# Patient Record
Sex: Female | Born: 1980 | Race: White | Hispanic: No | Marital: Married | Smoking: Current every day smoker
Health system: Southern US, Community
[De-identification: ages and names within clinical notes are randomized; demographics above are authoritative.]

## PROBLEM LIST (undated history)

## (undated) DIAGNOSIS — B009 Herpesviral infection, unspecified: Secondary | ICD-10-CM

## (undated) HISTORY — DX: Herpesviral infection, unspecified: B00.9

---

## 2004-05-15 ENCOUNTER — Emergency Department (HOSPITAL_COMMUNITY): Admission: EM | Admit: 2004-05-15 | Discharge: 2004-05-15 | Payer: Self-pay | Admitting: Emergency Medicine

## 2005-11-01 ENCOUNTER — Emergency Department (HOSPITAL_COMMUNITY): Admission: EM | Admit: 2005-11-01 | Discharge: 2005-11-02 | Payer: Self-pay | Admitting: Emergency Medicine

## 2006-12-28 ENCOUNTER — Inpatient Hospital Stay (HOSPITAL_COMMUNITY): Admission: AD | Admit: 2006-12-28 | Discharge: 2006-12-28 | Payer: Self-pay | Admitting: Obstetrics and Gynecology

## 2007-03-14 ENCOUNTER — Inpatient Hospital Stay (HOSPITAL_COMMUNITY): Admission: RE | Admit: 2007-03-14 | Discharge: 2007-03-15 | Payer: Self-pay | Admitting: Obstetrics and Gynecology

## 2009-09-15 ENCOUNTER — Inpatient Hospital Stay (HOSPITAL_COMMUNITY): Admission: RE | Admit: 2009-09-15 | Discharge: 2009-09-16 | Payer: Self-pay | Admitting: Obstetrics and Gynecology

## 2010-10-21 LAB — CBC
HCT: 38.9 % (ref 36.0–46.0)
Hemoglobin: 11.2 g/dL — ABNORMAL LOW (ref 12.0–15.0)
MCHC: 33.4 g/dL (ref 30.0–36.0)
MCV: 92.6 fL (ref 78.0–100.0)
Platelets: 189 10*3/uL (ref 150–400)
RDW: 14.6 % (ref 11.5–15.5)
RDW: 15.4 % (ref 11.5–15.5)
WBC: 21.6 10*3/uL — ABNORMAL HIGH (ref 4.0–10.5)

## 2010-12-18 NOTE — Discharge Summary (Signed)
Teresa Duran, Teresa Duran                 ACCOUNT NO.:  [id number]   MEDICAL RECORD NO.:  [id number]          PATIENT TYPE:  INP   LOCATION:  9144                          FACILITY:  [facility]   PHYSICIAN:  Huel Cote, M.D. DATE OF BIRTH:  06-01-1981   DATE OF ADMISSION:  03/14/2007  DATE OF DISCHARGE:  03/15/2007                               DISCHARGE SUMMARY   DISCHARGE DIAGNOSES:  1. Term pregnancy at 40 weeks delivered.  2. Status post normal spontaneous vaginal delivery.   DISCHARGE MEDICATIONS:  1. Motrin 600 mg p.o. every 6 hours.  2. Percocet one to two tablets p.o. every 4 hours p.r.n.   DISCHARGE FOLLOWUP:  The patient is to follow up in 6 weeks for her full  postpartum exam.   HOSPITAL COURSE:  The patient is 30 year old G1, P0 who was admitted at  74 and one-seventh weeks gestation for induction of labor given past due  date and a favorable cervix.  Prenatal care had been uncomplicated with  positive history of herpes but on Valtrex suppression.  She was also a  smoker.  Prenatal labs are as follows:  A positive, antibody negative,  RPR nonreactive, rubella immune, hepatitis B surface antigen negative,  HIV negative, GC negative, chlamydia negative, 1-hour Glucola 117, group  B strep negative.  Past OB history:  None.  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  Past surgical history:  Wisdom teeth.  Past medical history:  None.  Allergies:  None.  Home medications  included Valtrex and prenatal vitamins.   On admission she was afebrile with stable vital signs.  Fetal heart rate  was reactive.  Cervix was 80, 2+, and -2 station.  She had rupture of  membranes performed and was placed on Pitocin.  She reached complete  dilation and pushed well with normal spontaneous vaginal delivery of a  vigorous female infant.  Apgars were 9 and 9, weight was 6 pounds 12  ounces.  Placenta delivered spontaneously.  There is a right  periurethral laceration which was repaired  with 3-0 Vicryl.  Cervix and  rectum were intact.  By postpartum day #1 the patient was doing quite  well and requested early discharge home.  She was given Motrin and  Percocet prescriptions and instructed on pelvic rest and followup in 6  weeks.      Huel Cote, M.D.  Electronically Signed     KR/MEDQ  D:  04/07/2007  T:  04/07/2007  Job:  [id number]

## 2011-03-24 ENCOUNTER — Ambulatory Visit: Payer: Self-pay | Admitting: Gynecologic Oncology

## 2011-05-17 LAB — CBC
HCT: 35.3 — ABNORMAL LOW
MCHC: 33.9
Platelets: 142 — ABNORMAL LOW
RBC: 4.16
WBC: 14.3 — ABNORMAL HIGH

## 2011-05-17 LAB — RPR: RPR Ser Ql: NONREACTIVE

## 2012-11-30 ENCOUNTER — Emergency Department (HOSPITAL_BASED_OUTPATIENT_CLINIC_OR_DEPARTMENT_OTHER)
Admission: EM | Admit: 2012-11-30 | Discharge: 2012-11-30 | Disposition: A | Discharge status: Home / Self Care | Payer: 59 | Attending: Emergency Medicine | Admitting: Emergency Medicine

## 2012-11-30 ENCOUNTER — Encounter (HOSPITAL_BASED_OUTPATIENT_CLINIC_OR_DEPARTMENT_OTHER): Payer: Self-pay | Admitting: Emergency Medicine

## 2012-11-30 DIAGNOSIS — Z3202 Encounter for pregnancy test, result negative: Secondary | ICD-10-CM | POA: Insufficient documentation

## 2012-11-30 DIAGNOSIS — R031 Nonspecific low blood-pressure reading: Secondary | ICD-10-CM | POA: Insufficient documentation

## 2012-11-30 DIAGNOSIS — R112 Nausea with vomiting, unspecified: Secondary | ICD-10-CM | POA: Insufficient documentation

## 2012-11-30 DIAGNOSIS — N390 Urinary tract infection, site not specified: Secondary | ICD-10-CM | POA: Insufficient documentation

## 2012-11-30 DIAGNOSIS — F17200 Nicotine dependence, unspecified, uncomplicated: Secondary | ICD-10-CM | POA: Insufficient documentation

## 2012-11-30 DIAGNOSIS — R42 Dizziness and giddiness: Secondary | ICD-10-CM | POA: Insufficient documentation

## 2012-11-30 DIAGNOSIS — R55 Syncope and collapse: Secondary | ICD-10-CM | POA: Insufficient documentation

## 2012-11-30 LAB — URINALYSIS, ROUTINE W REFLEX MICROSCOPIC
Bilirubin Urine: NEGATIVE
Nitrite: NEGATIVE
Specific Gravity, Urine: 1.02 (ref 1.005–1.030)
pH: 6.5 (ref 5.0–8.0)

## 2012-11-30 LAB — PREGNANCY, URINE: Preg Test, Ur: NEGATIVE

## 2012-11-30 LAB — URINE MICROSCOPIC-ADD ON

## 2012-11-30 MED ORDER — NITROFURANTOIN MONOHYD MACRO 100 MG PO CAPS
100.0000 mg | ORAL_CAPSULE | Freq: Once | ORAL | Status: AC
  Administered 2012-11-30: 100 mg via ORAL
  Filled 2012-11-30: qty 1

## 2012-11-30 MED ORDER — NITROFURANTOIN MONOHYD MACRO 100 MG PO CAPS: 100.0000 mg | ORAL_CAPSULE | Freq: Two times a day (BID) | ORAL | Status: DC

## 2012-11-30 MED ORDER — ONDANSETRON HCL 4 MG/2ML IJ SOLN
4.0000 mg | Freq: Once | INTRAMUSCULAR | Status: DC
  Filled 2012-11-30: qty 2

## 2012-11-30 MED ORDER — SODIUM CHLORIDE 0.9 % IV BOLUS (SEPSIS)
1000.0000 mL | Freq: Once | INTRAVENOUS | Status: AC
  Administered 2012-11-30: 1000 mL via INTRAVENOUS

## 2012-11-30 NOTE — ED Provider Notes (Addendum)
History     CSN: [id number]  Arrival date & time 11/30/12  0059   First MD Initiated Contact with Patient 11/30/12 0106      Chief Complaint  Patient presents with  . Syncope     (Consider location/radiation/quality/duration/timing/severity/associated sxs/prior treatment) HPI This is a 32 year old female who just returned from a trip to a water park yesterday. The water park was very hot and stuffy. The patient states she had not been eating or drinking as well as she should. She was preparing to walk up the stairs at home just prior to arrival when she felt lightheaded. About 2 minutes later she had a syncopal episode. She was incontinent of urine but there was no tonic-clonic activity noted. When she regained consciousness she became nauseated and vomited. She was given IV fluids by EMS prior to arrival with improvement in symptoms. She was not given an antiemetic to arrival because her blood pressure was low. She is only mildly nauseated at this time. She had no chest pain or shortness of breath.  History reviewed. No pertinent past medical history.  History reviewed. No pertinent past surgical history.  No family history on file.  History  Substance Use Topics  . Smoking status: Current Every Day Smoker  . Smokeless tobacco: Not on file  . Alcohol Use: Yes    OB History   Grav Para Term Preterm Abortions TAB SAB Ect Mult Living                  Review of Systems  All other systems reviewed and are negative.    Allergies  Review of patient's allergies indicates no known allergies.  Home Medications  No current outpatient prescriptions on file.  BP 99/66  Pulse 62  Temp(Src) 98.2 F (36.8 C) (Oral)  Resp 18  Ht 5' 3.75" (1.619 m)  Wt 113 lb (51.256 kg)  BMI 19.55 kg/m2  SpO2 99%  Physical Exam General: Well-developed, well-nourished female in no acute distress; appearance consistent with age of record HENT: normocephalic, atraumatic; no bite marks on  tongue Eyes: pupils equal round and reactive to light; extraocular muscles intact Neck: supple Heart: regular rate and rhythm; no ectopy Lungs: clear to auscultation bilaterally Abdomen: soft; nondistended; nontender; no masses or hepatosplenomegaly; bowel sounds present Extremities: No deformity; full range of motion; pulses normal; no edema Neurologic: Awake, alert and oriented; motor function intact in all extremities and symmetric; no facial droop Skin: Warm and dry Psychiatric: Normal mood and affect    ED Course  Procedures (including critical care time)     MDM   Nursing notes and vitals signs, including pulse oximetry, reviewed.  Summary of this visit's results, reviewed by myself:  Labs:  Results for orders placed during the hospital encounter of 11/30/12 (from the past 24 hour(s))  URINALYSIS, ROUTINE W REFLEX MICROSCOPIC     Status: Abnormal   Collection Time    11/30/12  1:26 AM      Result Value Range   Color, Urine YELLOW  YELLOW   APPearance CLEAR  CLEAR   Specific Gravity, Urine 1.020  1.005 - 1.030   pH 6.5  5.0 - 8.0   Glucose, UA NEGATIVE  NEGATIVE mg/dL   Hgb urine dipstick NEGATIVE  NEGATIVE   Bilirubin Urine NEGATIVE  NEGATIVE   Ketones, ur NEGATIVE  NEGATIVE mg/dL   Protein, ur NEGATIVE  NEGATIVE mg/dL   Urobilinogen, UA 0.2  0.0 - 1.0 mg/dL   Nitrite NEGATIVE  NEGATIVE  Leukocytes, UA MODERATE (*) NEGATIVE  PREGNANCY, URINE     Status: None   Collection Time    11/30/12  1:26 AM      Result Value Range   Preg Test, Ur NEGATIVE  NEGATIVE  URINE MICROSCOPIC-ADD ON     Status: Abnormal   Collection Time    11/30/12  1:26 AM      Result Value Range   Squamous Epithelial / LPF MANY (*) RARE   WBC, UA 7-10  <3 WBC/hpf   RBC / HPF 0-2  <3 RBC/hpf   Bacteria, UA MANY (*) RARE   Urine-Other MUCOUS PRESENT      Date: 11/30/2012 1:15 AM  Rate: 65  Rhythm: normal sinus rhythm  QRS Axis: normal  Intervals: normal  ST/T Wave abnormalities:  normal  Conduction Disutrbances: none  Narrative Interpretation: unremarkable  Comparison with previous EKG: none available  2:11 AM The patient feels better after IV fluid bolus. She states she has not had a period in over a year due to an IUD.  Her husband, who witnessed her syncope, denies any seizure-like activity. He states she looked very pale before she came to.          Hanley Seamen, MD 11/30/12 1610  Hanley Seamen, MD 11/30/12 9604

## 2012-11-30 NOTE — ED Notes (Signed)
Pt had syncopal episode tonight. Pt vomited after. Pt was hypotensive with EMS. Pt states she didn't eat dinner tonight.

## 2012-11-30 NOTE — ED Notes (Signed)
Pt received a NS bolus by EMS

## 2012-11-30 NOTE — ED Notes (Signed)
Zofran was ordered by MD. Pt states she is no longer nauseated and declines zofran. MD made aware.

## 2012-12-01 LAB — URINE CULTURE: Colony Count: 5000

## 2013-01-01 ENCOUNTER — Encounter: Payer: Self-pay | Admitting: Diagnostic Neuroimaging

## 2013-01-01 ENCOUNTER — Ambulatory Visit (INDEPENDENT_AMBULATORY_CARE_PROVIDER_SITE_OTHER): Payer: 59 | Admitting: Diagnostic Neuroimaging

## 2013-01-01 VITALS — BP 97/71 | HR 94 | Temp 98.7°F | Ht 63.75 in | Wt 112.5 lb

## 2013-01-01 DIAGNOSIS — R55 Syncope and collapse: Secondary | ICD-10-CM

## 2013-01-01 NOTE — Progress Notes (Signed)
[facility]  PATIENT: Teresa Duran DOB: 1980-08-15  REFERRING CLINICIAN:  Marinda Elk, MD HISTORY FROM: patient, ER notes, husband notes REASON FOR VISIT: consult   HISTORICAL  CHIEF COMPLAINT:  Chief Complaint  Patient presents with  . Neurologic Problem    passed out for about 45 mins    HISTORY OF PRESENT ILLNESS:  This is a 32 year old female who had a syncopal episode just before midnight on 11/29/12.    She had just returned from a trip to a water park the day before. The water park was very hot and stuffy. The patient states she had not been eating or drinking as well as she should. She was preparing to walk up the stairs at home when she felt lightheaded. About 2 minutes later she had a syncopal episode. Her husband reports that she collapsed and became unresponsive. Her eyes were open but "the lights weren't on."  She stated her husband thought she wasn't breathing and started CPR (chest thump, no rescue breathing) and quickly called EMS which arrived in 5 minutes. He did not check for pulse. She was incontinent of urine but there was no tonic-clonic activity noted by her husband. No tongue biting. When she regained consciousness she became nauseated and vomited. She regained consciousness before EMS arrived. She was not given an antiemetic to arrival because her blood pressure was low. She had no chest pain or shortness of breath. Patient reports extreme physical/mental disorientation for about 45 minutes after the episode. She was given IV fluids by EMS prior to arrival to ER with improvement in symptoms.   Patient reports that she has daily episodes of feeling lightheaded but does not pass out. She states that she probably doesn't drink as much water as she should.  She has complaints of frequent headaches, mostly late in the day with sensitivity to light and sound.  She states that she doesn't eat much during the day. She has never had an episode like this  before. She knows of no family history of seizure, cardiac issues such as sudden cardiac death.   REVIEW OF SYSTEMS: Full 14 system review of systems performed and notable only for constitutional: Fatigue, eyes: Eye pain, respiratory: Cough, ears/nose/throat: Hearing in the ears, spinning sensation, musculoskeletal: Joint pain, allergy: Allergies, runny nose, skin: Rash, neurological: Confusion, headache, numbness, dizziness, seizure? Passing out? Sleep: Sleepiness, psychiatric: Not met sleep, decreased energy.  ALLERGIES: Allergies  Allergen Reactions  . Other     cats    HOME MEDICATIONS: Outpatient Prescriptions Prior to Visit  Medication Sig Dispense Refill  . nitrofurantoin, macrocrystal-monohydrate, (MACROBID) 100 MG capsule Take 1 capsule (100 mg total) by mouth 2 (two) times daily. X 7 days  14 capsule  0   No facility-administered medications prior to visit.    PAST MEDICAL HISTORY: Past Medical History  Diagnosis Date  . Herpes     PAST SURGICAL HISTORY: History reviewed. No pertinent past surgical history.  FAMILY HISTORY: Family History  Problem Relation Age of Onset  . Prostate cancer Paternal Uncle   . Diabetes Maternal Grandmother     SOCIAL HISTORY: History   Social History  . Marital Status: Married    Spouse Name: Riki Rusk    Number of Children: 2  . Years of Education: BAx2   Occupational History  . [employer]    Social History Main Topics  . Smoking status: Current Every Day Smoker -- 1.00 packs/day for 16 years    Types: Cigarettes  .  Smokeless tobacco: Never Used  . Alcohol Use: Yes     Comment: socially  . Drug Use: Yes    Special: Marijuana     Comment: occasional  . Sexually Active: Not on file   Other Topics Concern  . Not on file   Social History Narrative   Pt live at home with spouse.   Caffeine Use: 1-2 cups daily     PHYSICAL EXAM  Filed Vitals:   01/01/13 0932 01/01/13 0950  BP: 90/63 97/71  Pulse: 81 94  Temp:  98.7 F (37.1 C)   TempSrc: Oral   Height: 5' 3.75" (1.619 m)   Weight: 112 lb 8 oz (51.03 kg)    Body mass index is 19.47 kg/(m^2).  GENERAL EXAM: Patient is in no distress, Thin female, pleasant  CARDIOVASCULAR: Regular rate and rhythm, no murmurs, no carotid bruits  NEUROLOGIC: MENTAL STATUS: awake, alert, language fluent, comprehension intact, naming intact CRANIAL NERVE: no papilledema on fundoscopic exam, pupils equal and reactive to light, visual fields full to confrontation, extraocular muscles intact, no nystagmus, facial sensation and strength symmetric, uvula midline, shoulder shrug symmetric, tongue midline. MOTOR: normal bulk and tone, full strength in the BUE, BLE SENSORY: normal and symmetric to light touch, pinprick, temperature, vibration  COORDINATION: finger-nose-finger, fine finger movements normal REFLEXES: deep tendon reflexes present and symmetric GAIT/STATION: narrow based gait; able to walk on toes, heels and tandem; romberg is negative   DIAGNOSTIC DATA (LABS, IMAGING, TESTING) - I reviewed patient records, labs, notes, testing and imaging myself where available.  Urinalysis:Routine with reflex micro 11/30/12 Moderate Leukocytes, Many Bacteria  EKG, 11/30/12: NSR, Rate 65.   ASSESSMENT AND PLAN  32 y.o. year old female  here with episode of loss of consciousness which occurred in November 29, 2012.  Most likely this was syncope triggered by dehydration and UTI (prodromal lightheadedness, low BP, pale appearance, nausea).  Seizure is also a possibility (prolonged post-ictal, eyes open, confusion). We will check a MRI of the brain and EEG to evaluate for seizure.  We recommend patient does not drive if she feels lightheaded.  Ddx: provoked syncope (dehydration, UTI, cardiogenic) vs. seizure  PLAN:  1. Check MRI brain and EEG 2. Follow up with Primary care to evaluate for cardiac etiologies (consider cardiology consult) 3. Okay to continue driving at this  time 4. Followup in office in 6-8 weeks   Suanne Marker, MD (with LYNN LAM NP-C 01/01/2013, 9:55 AM) Certified in Neurology, Neurophysiology and Neuroimaging  [facility] [address] [place], [place] [address] [phone]

## 2013-01-01 NOTE — Patient Instructions (Addendum)
We will order MRI of the brain and EEG to evaluate for seizures.  Follow up in 6-8 weeks.

## 2013-01-10 ENCOUNTER — Ambulatory Visit (INDEPENDENT_AMBULATORY_CARE_PROVIDER_SITE_OTHER): Payer: 59

## 2013-01-10 DIAGNOSIS — R55 Syncope and collapse: Secondary | ICD-10-CM

## 2013-01-16 ENCOUNTER — Telehealth: Payer: Self-pay | Admitting: Diagnostic Neuroimaging

## 2013-01-25 ENCOUNTER — Telehealth: Payer: Self-pay | Admitting: Diagnostic Neuroimaging

## 2013-01-25 NOTE — Telephone Encounter (Signed)
Called patient with normal MRI results still waiting on EEG results told patient i will call her once i have received those.

## 2013-01-26 NOTE — Procedures (Signed)
   [facility]  EEG (ELECTROENCEPHALOGRAM) REPORT   STUDY DATE: 01/10/13 PATIENT NAME: Teresa Duran DOB: [birth year] MRN: [id number]  ORDERING CLINICIAN: Joycelyn Schmid, MD   TECHNOLOGIST: Gearldine Shown TECHNIQUE: Electroencephalogram was recorded utilizing standard 10-20 system of lead placement and reformatted into average and bipolar montages.  RECORDING TIME: 29 minutes ACTIVATION: hyperventilation and photic stimulation  CLINICAL INFORMATION: 32 year old female with syncope.  FINDINGS: Background rhythms of 9-10 hertz and 70-80 microvolts. No focal, lateralizing, epileptiform activity or seizures are seen. Patient recorded in the awake state.   IMPRESSION:  Normal EEG in the awake state.   INTERPRETING PHYSICIAN:  Suanne Marker, MD Certified in Neurology, Neurophysiology and Neuroimaging  [facility] [address], [address] [place], [place] [address] [phone]

## 2013-03-20 ENCOUNTER — Ambulatory Visit: Payer: 59 | Admitting: Diagnostic Neuroimaging

## 2013-10-10 NOTE — Telephone Encounter (Signed)
Pt did not r/s visit closing encounter

## 2018-11-01 ENCOUNTER — Other Ambulatory Visit: Payer: Self-pay | Admitting: Obstetrics and Gynecology

## 2018-11-01 DIAGNOSIS — N6310 Unspecified lump in the right breast, unspecified quadrant: Secondary | ICD-10-CM

## 2018-11-06 ENCOUNTER — Ambulatory Visit
Admission: RE | Admit: 2018-11-06 | Discharge: 2018-11-06 | Disposition: A | Discharge status: Home / Self Care | Payer: Managed Care, Other (non HMO) | Source: Ambulatory Visit | Attending: Obstetrics and Gynecology | Admitting: Obstetrics and Gynecology

## 2018-11-06 ENCOUNTER — Other Ambulatory Visit: Payer: Self-pay

## 2018-11-06 ENCOUNTER — Other Ambulatory Visit: Payer: Self-pay | Admitting: Obstetrics and Gynecology

## 2018-11-06 DIAGNOSIS — N6310 Unspecified lump in the right breast, unspecified quadrant: Secondary | ICD-10-CM

## 2019-05-08 ENCOUNTER — Ambulatory Visit
Admission: RE | Admit: 2019-05-08 | Discharge: 2019-05-08 | Disposition: A | Discharge status: Home / Self Care | Payer: Managed Care, Other (non HMO) | Source: Ambulatory Visit | Attending: Obstetrics and Gynecology | Admitting: Obstetrics and Gynecology

## 2019-05-08 ENCOUNTER — Other Ambulatory Visit: Payer: Self-pay | Admitting: Obstetrics and Gynecology

## 2019-05-08 ENCOUNTER — Other Ambulatory Visit: Payer: Self-pay

## 2019-05-08 DIAGNOSIS — N6310 Unspecified lump in the right breast, unspecified quadrant: Secondary | ICD-10-CM

## 2019-05-11 ENCOUNTER — Other Ambulatory Visit: Payer: Managed Care, Other (non HMO)

## 2019-11-06 ENCOUNTER — Other Ambulatory Visit: Payer: Managed Care, Other (non HMO)

## 2020-01-23 ENCOUNTER — Ambulatory Visit
Admission: RE | Admit: 2020-01-23 | Discharge: 2020-01-23 | Disposition: A | Discharge status: Home / Self Care | Payer: Managed Care, Other (non HMO) | Source: Ambulatory Visit | Attending: Obstetrics and Gynecology | Admitting: Obstetrics and Gynecology

## 2020-01-23 ENCOUNTER — Other Ambulatory Visit: Payer: Self-pay

## 2020-01-23 DIAGNOSIS — N6310 Unspecified lump in the right breast, unspecified quadrant: Secondary | ICD-10-CM

## 2020-05-14 IMAGING — MG DIGITAL DIAGNOSTIC BILATERAL MAMMOGRAM WITH TOMO AND CAD
6 of 10 series · 6 of 30 positions shown · non-contrast
Comparison: None.

CLINICAL DATA: Patient describes a palpable lump within the RIGHT
breast. This is patient's baseline mammogram.

EXAM:
DIGITAL DIAGNOSTIC BILATERAL MAMMOGRAM WITH CAD AND TOMO
ULTRASOUND RIGHT BREAST

[L MLO synth-2D]
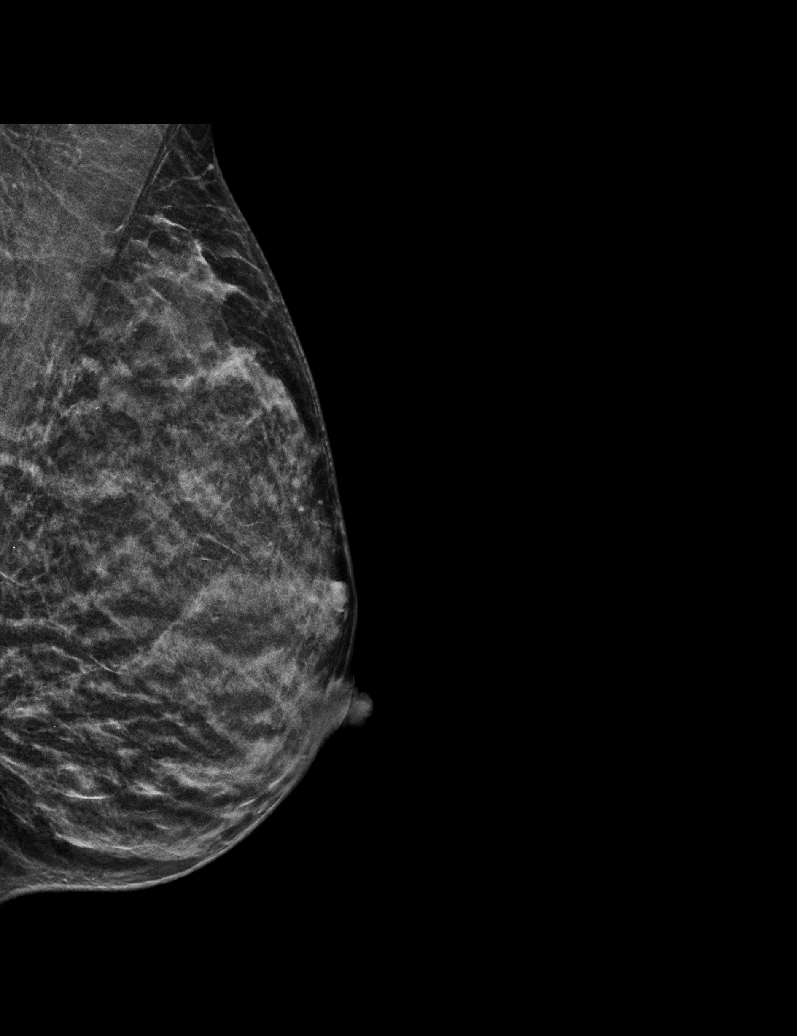

[R TAN synth-2D]
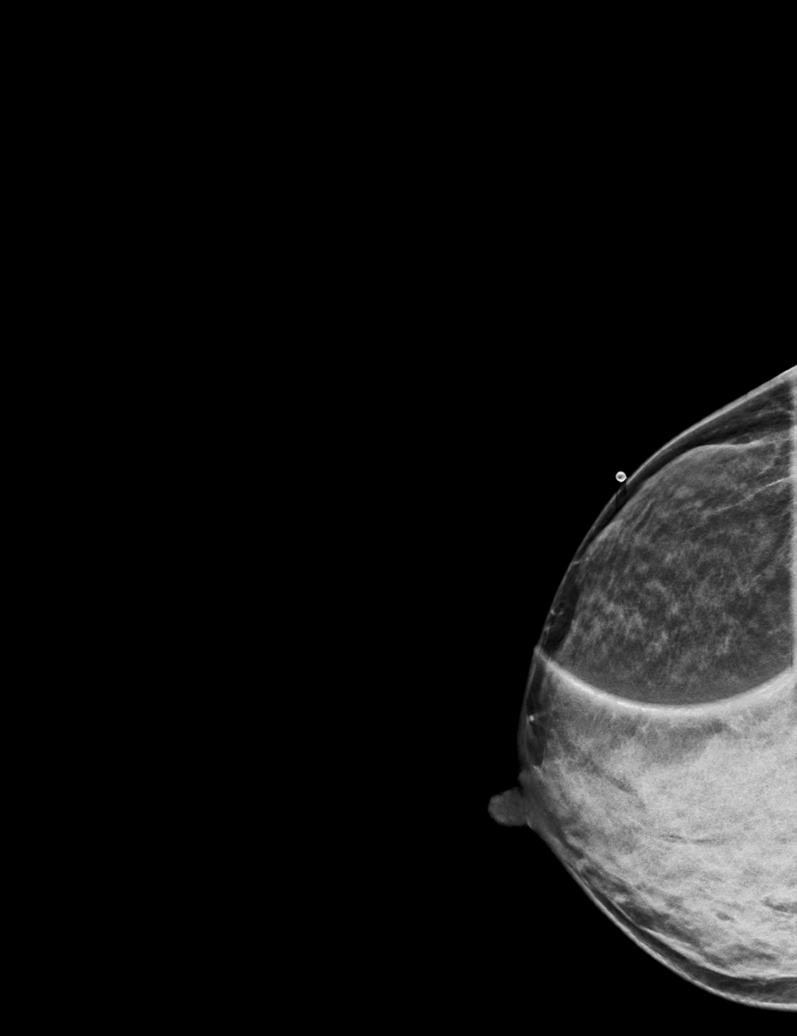

[R CC synth-2D]
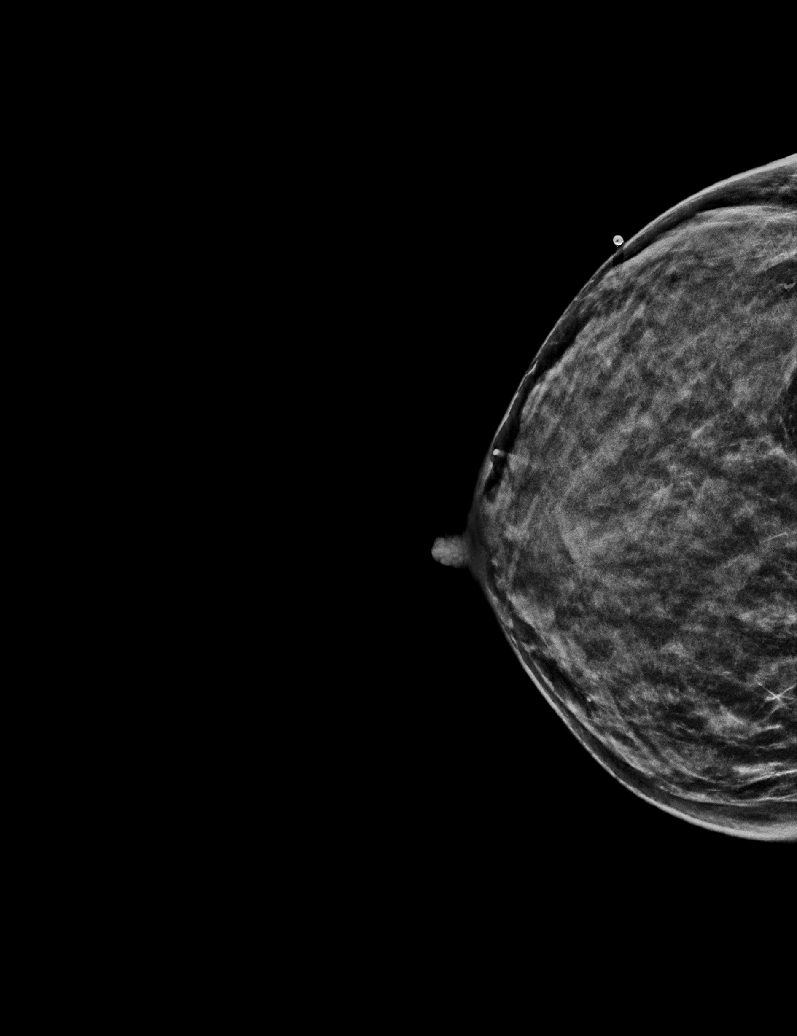

[L CC synth-2D]
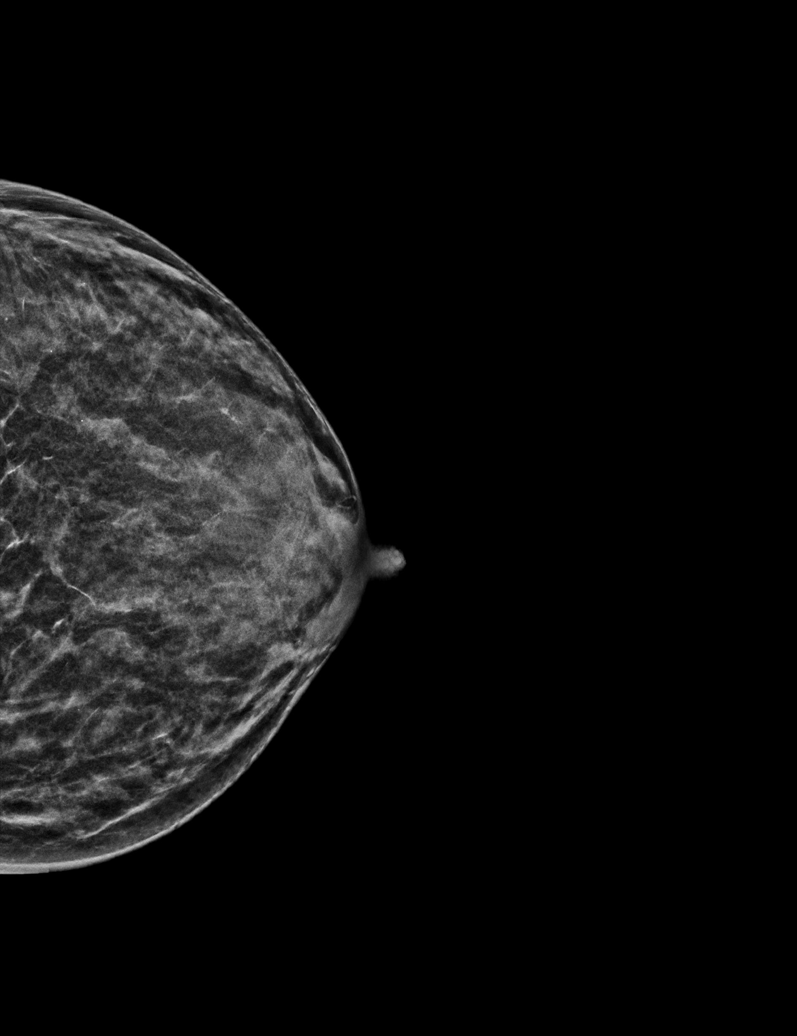

[R MLO synth-2D]
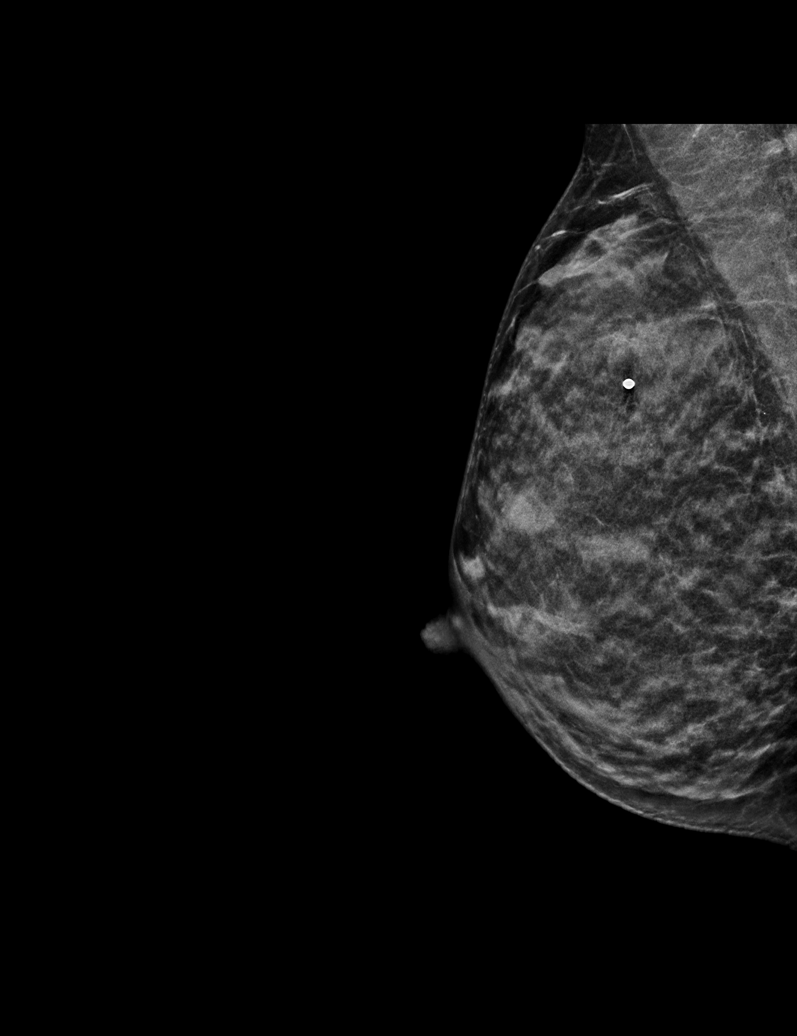

[R TAN tomo · tomo slice 15/30.0]
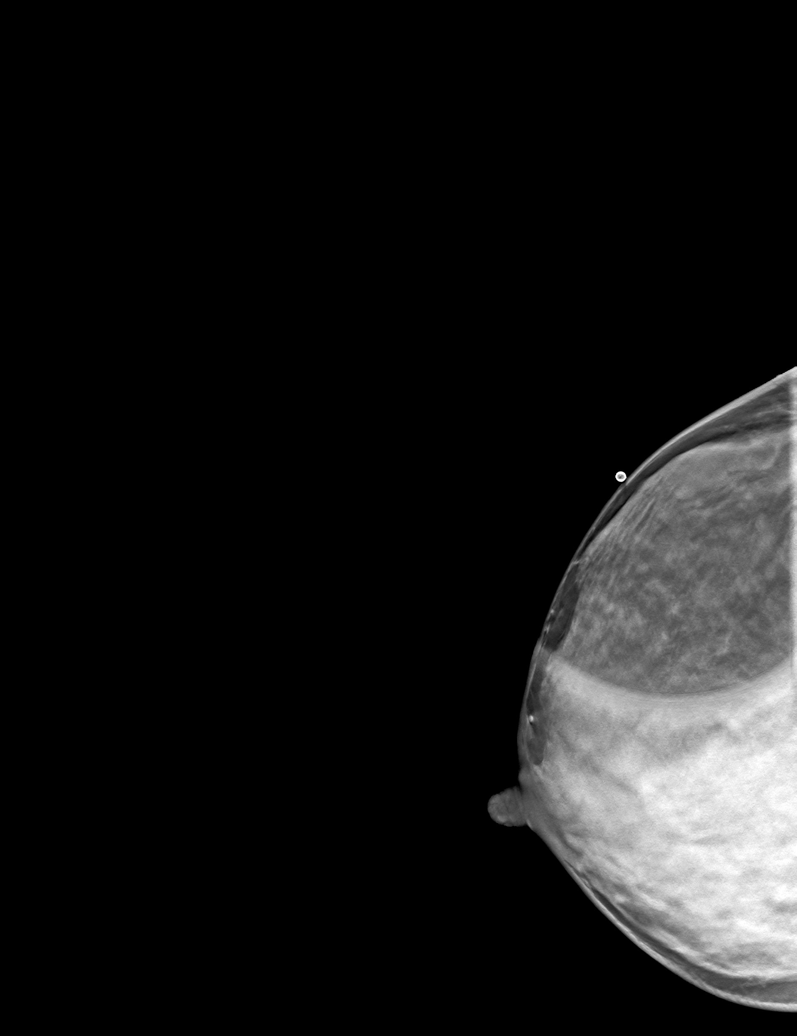

[6 of 30 positions shown; findings below may reference images not displayed]

ACR Breast Density Category c: The breast tissue is heterogeneously
dense, which may obscure small masses.
FINDINGS: RIGHT breast: There is a partially obscured mass within the outer
RIGHT breast, at posterior depth, measuring approximately 1.5 cm
greatest dimension, corresponding to the area of clinical concern
with overlying skin marker in place.

LEFT breast: There are no dominant masses, suspicious calcifications
or secondary signs of malignancy within the LEFT breast.

Mammographic images were processed with CAD.

Targeted ultrasound is performed, showing a mixed echogenicity mass
within the RIGHT breast at the 10 o'clock axis, 6 cm from the
nipple, measuring 1.5 x 0.7 x 1.4 cm, corresponding to the site of
patient's palpable lump, most suggestive of a benign cluster of
cysts.
IMPRESSION: 1. Probably benign cluster of cysts within the RIGHT breast at the
10 o'clock axis, 6 cm from the nipple, measuring 1.5 x 0.7 x 1.4 cm,
corresponding to the area of clinical concern. Recommend follow-up
ultrasound in 6 months to ensure stability.
2. No evidence of malignancy within the LEFT breast.

RECOMMENDATION:
RIGHT breast ultrasound in 6 months.

The patient was instructed to return sooner if the area that she
feels becomes larger and/or firmer to palpation, or if a new
palpable abnormality is identified in either breast.

I have discussed the findings and recommendations with the patient.
Results were also provided in writing at the conclusion of the
visit. If applicable, a reminder letter will be sent to the patient
regarding the next appointment.

BI-RADS CATEGORY  3: Probably benign.
